# Patient Record
Sex: Female | Born: 1995 | Hispanic: No | Marital: Single
Health system: Southern US, Community
[De-identification: ages and names within clinical notes are randomized; demographics above are authoritative.]

---
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
## 2016-01-12 ENCOUNTER — Other Ambulatory Visit: Payer: BLUE CROSS/BLUE SHIELD

## 2016-01-25 ENCOUNTER — Ambulatory Visit
Admission: RE | Admit: 2016-01-25 | Discharge: 2016-01-25 | Disposition: A | Discharge status: Home / Self Care | Payer: No Typology Code available for payment source | Source: Ambulatory Visit | Attending: Internal Medicine | Admitting: Internal Medicine

## 2016-01-25 DIAGNOSIS — N6320 Unspecified lump in the left breast, unspecified quadrant: Secondary | ICD-10-CM

## 2016-01-25 DIAGNOSIS — N6310 Unspecified lump in the right breast, unspecified quadrant: Secondary | ICD-10-CM

## 2016-08-22 ENCOUNTER — Other Ambulatory Visit: Payer: Self-pay | Admitting: Internal Medicine

## 2016-08-22 DIAGNOSIS — D241 Benign neoplasm of right breast: Secondary | ICD-10-CM

## 2016-08-22 DIAGNOSIS — D242 Benign neoplasm of left breast: Principal | ICD-10-CM

## 2016-09-05 ENCOUNTER — Other Ambulatory Visit: Payer: No Typology Code available for payment source

## 2016-09-08 ENCOUNTER — Ambulatory Visit
Admission: RE | Admit: 2016-09-08 | Discharge: 2016-09-08 | Disposition: A | Discharge status: Home / Self Care | Payer: BLUE CROSS/BLUE SHIELD | Source: Ambulatory Visit | Attending: Internal Medicine | Admitting: Internal Medicine

## 2016-09-08 ENCOUNTER — Other Ambulatory Visit: Payer: Self-pay | Admitting: Internal Medicine

## 2016-09-08 DIAGNOSIS — D242 Benign neoplasm of left breast: Principal | ICD-10-CM

## 2016-09-08 DIAGNOSIS — D241 Benign neoplasm of right breast: Secondary | ICD-10-CM

## 2016-10-10 IMAGING — CR DG CHEST 2V
2 series · 2 of 2 positions shown · non-contrast
Comparison: None.

CLINICAL DATA: Right chest wall/rib palpable abnormality on exam.

EXAM:
CHEST  2 VIEW

[w chest pa]
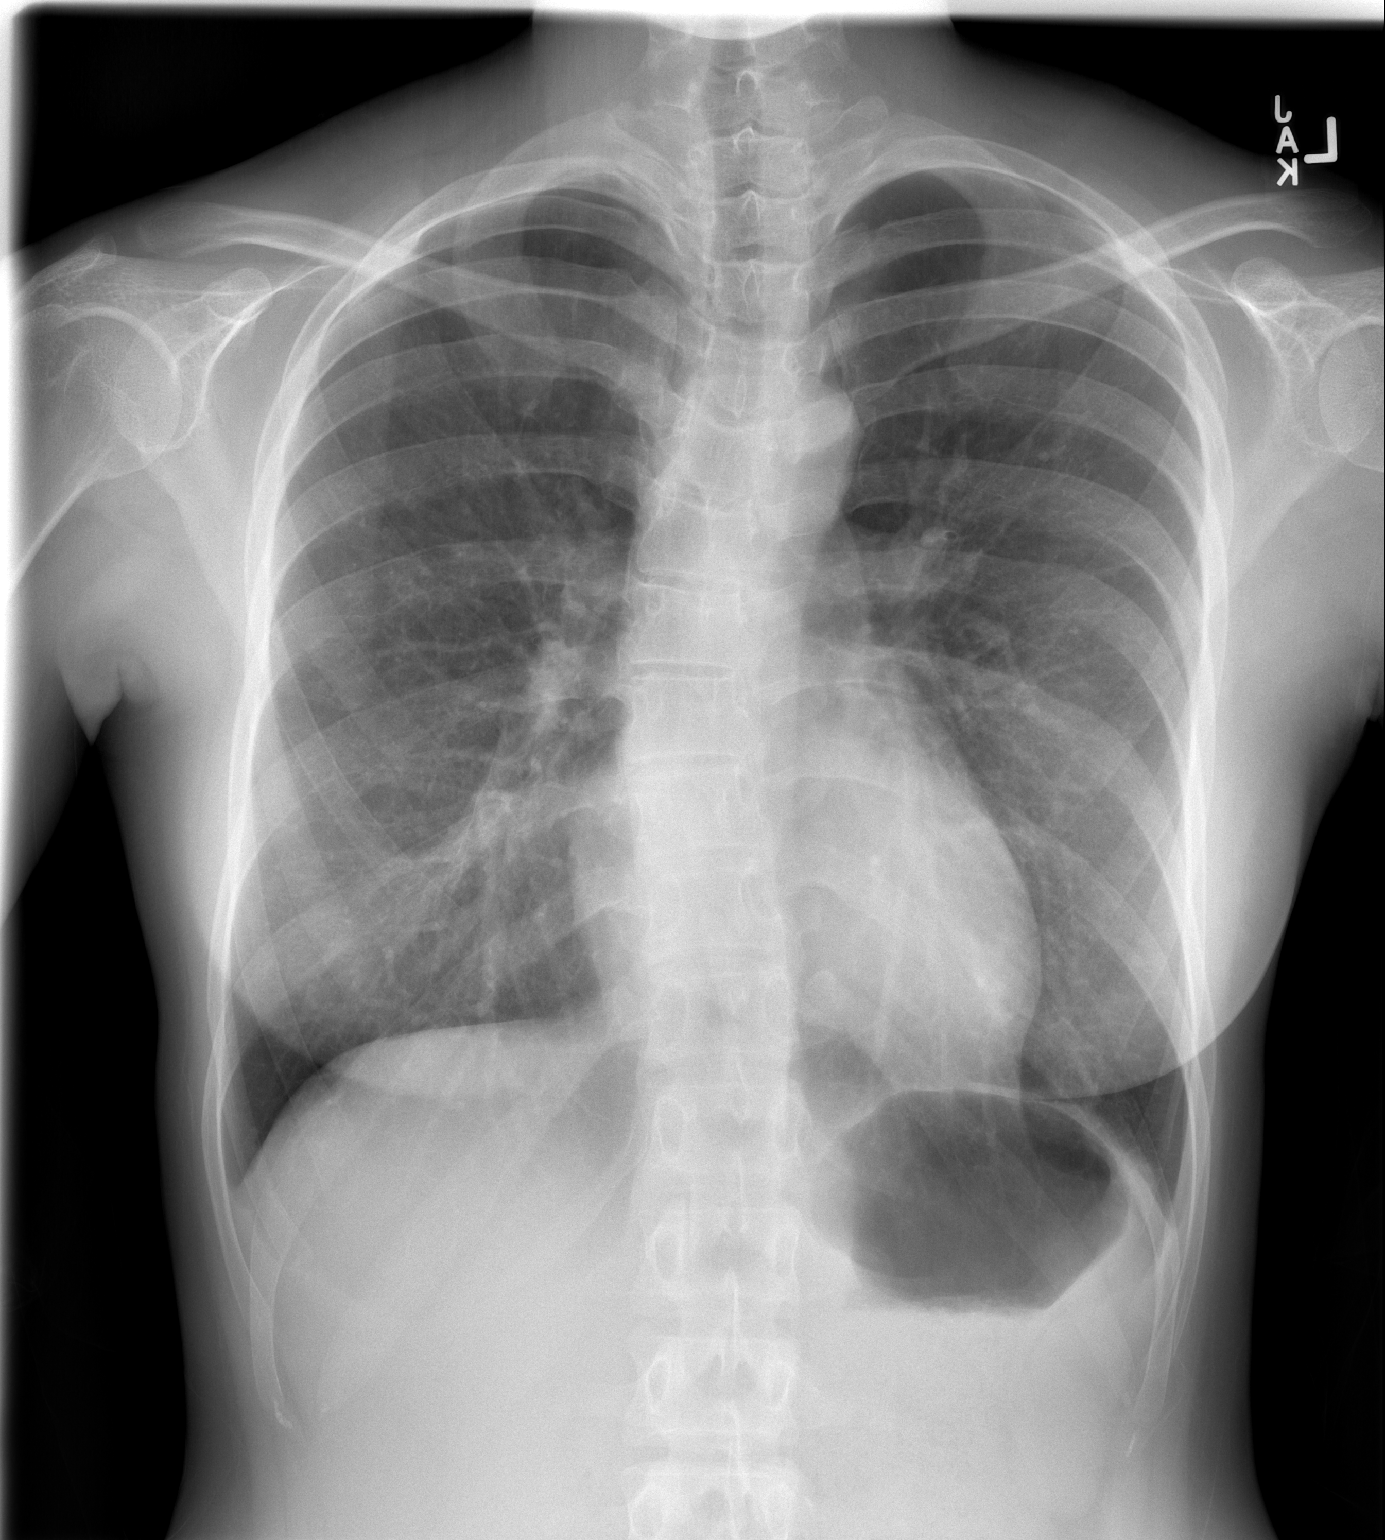

[w chest lat]
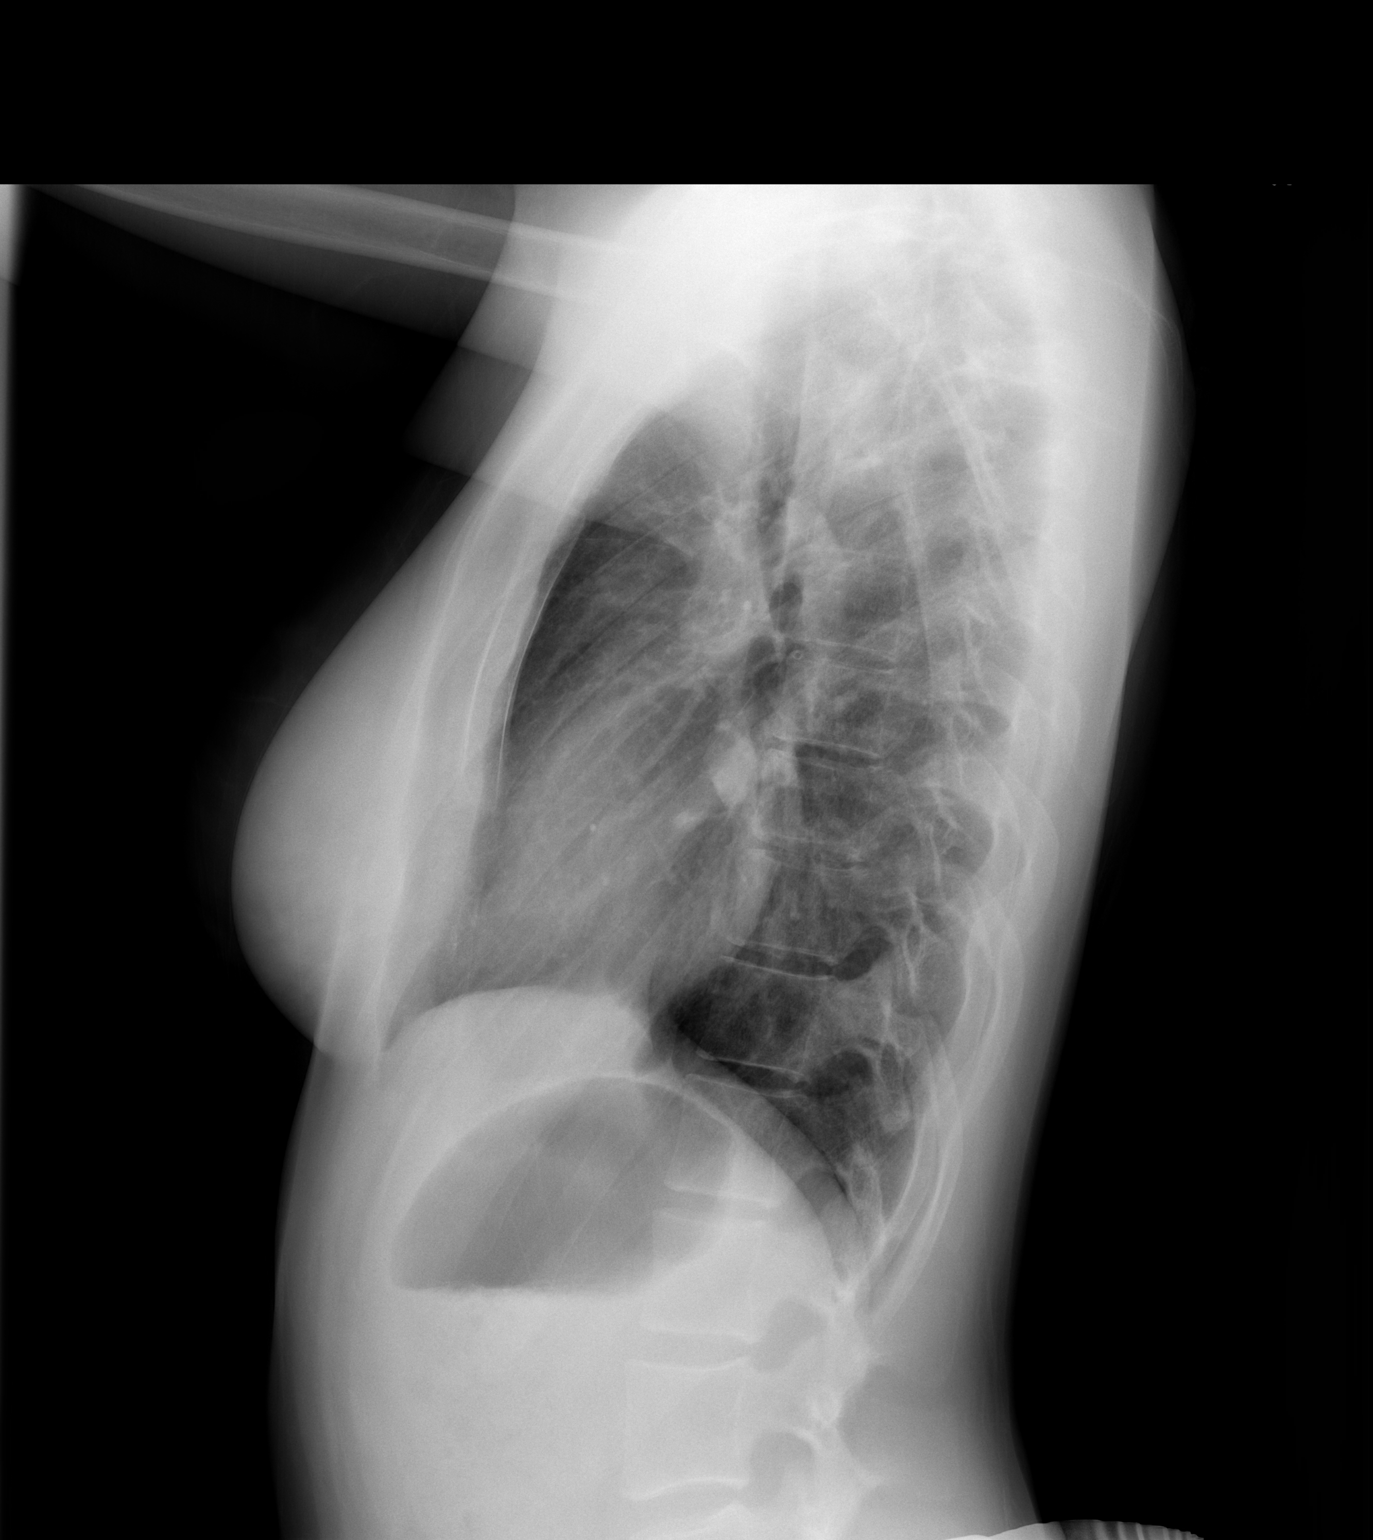

[2 of 2 positions shown; findings below may reference images not displayed]

FINDINGS: The heart size and mediastinal contours are within normal limits.
Both lungs are clear. No evidence of pneumothorax or pleural
effusion.

Mild thoracic dextroscoliosis is seen.  Mild pectus excavatum noted.
IMPRESSION: No active cardiopulmonary disease.

Mild pectus excavatum and thoracic dextroscoliosis.

## 2017-03-14 ENCOUNTER — Other Ambulatory Visit: Payer: BLUE CROSS/BLUE SHIELD

## 2017-08-04 ENCOUNTER — Other Ambulatory Visit: Payer: Self-pay | Admitting: Internal Medicine

## 2017-08-04 DIAGNOSIS — N390 Urinary tract infection, site not specified: Secondary | ICD-10-CM

## 2017-08-10 ENCOUNTER — Other Ambulatory Visit: Payer: BLUE CROSS/BLUE SHIELD

## 2017-08-14 ENCOUNTER — Other Ambulatory Visit: Payer: BLUE CROSS/BLUE SHIELD

## 2017-08-22 ENCOUNTER — Other Ambulatory Visit: Payer: BLUE CROSS/BLUE SHIELD

## 2018-07-05 ENCOUNTER — Ambulatory Visit: Payer: Self-pay | Admitting: *Deleted

## 2018-07-05 NOTE — Telephone Encounter (Signed)
  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   She does not have symptoms but was exposed to a co-worker who tested positive for the virus.  I asked if she had a PCP and he is Dr. Jilda Panda.   She is going to call him and see if there is somewhere she can go for a rapid COVID-19 test because she needs the results by Monday.   Our tests require 48-72 hr turn around time.   She needs a result before then.  I let her know to call back or have her doctor put in an order if there isn't a place to get a rapid test done.   She was agreeable to this plan.   Reason for Disposition . [1] COVID-19 EXPOSURE (Close Contact) within last 14 days AND [2] needs COVID-19 lab test to return to work AND [3] NO symptoms  Answer Assessment - Initial Assessment Questions 1. CLOSE CONTACT: "Who is the person with the confirmed or suspected COVID-19 infection that you were exposed to?"  I was exposed to a co-worker who is positive for COVID-19.   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  I don't have any symptoms.     *No Answer*2. PLACE of CONTACT: "Where were you when you were exposed to COVID-19?" (e.g., home, school, medical waiting room; which city?)     *No Answer*My work place 3. TYPE of CONTACT: "How much contact was there?" (e.g., sitting next to, live in same house, work in same office, same building)     We were working 6 feet apart because we have to work together.   I had my mask on. 4. DURATION of CONTACT: "How long were you in contact with the COVID-19 patient?" (e.g., a few seconds, passed by person, a few minutes, live with the patient)     I worked with her Friday and Sunday for 8 hr shifts. 5. DATE of CONTACT: "When did you have contact with a COVID-19 patient?" (e.g., how many days ago)     See above 6. TRAVEL: "Have you traveled out of the country recently?" If so, "When and where?"     * Also ask about out-of-state  travel, since the CDC has identified some high-risk cities for community spread in the [place].     * Note: Travel becomes less relevant if there is widespread community transmission where the patient lives.     No travels 7. COMMUNITY SPREAD: "Are there lots of cases of COVID-19 (community spread) where you live?" (See public health department website, if unsure)       Yes 8. SYMPTOMS: "Do you have any symptoms?" (e.g., fever, cough, breathing difficulty)     No 9. PREGNANCY OR POSTPARTUM: "Is there any chance you are pregnant?" "When was your last menstrual period?" "Did you deliver in the last 2 weeks?"     No 10. HIGH RISK: "Do you have any heart or lung problems? Do you have a weak immune system?" (e.g., CHF, COPD, asthma, HIV positive, chemotherapy, renal failure, diabetes mellitus, sickle cell anemia)       hypothyroidism  Protocols used: CORONAVIRUS (COVID-19) EXPOSURE-A-AH

## 2019-04-04 ENCOUNTER — Ambulatory Visit: Payer: Self-pay | Attending: Internal Medicine

## 2019-04-04 DIAGNOSIS — Z23 Encounter for immunization: Secondary | ICD-10-CM

## 2019-04-04 NOTE — Progress Notes (Signed)
   Covid-19 Vaccination Clinic  Name:  Ariel Griffin    MRN: [id number] DOB: [birth year]  04/04/2019  [name] was observed post Covid-19 immunization for 15 minutes without incident. She was provided with Vaccine Information Sheet and instruction to access the V-Safe system.   [name] was instructed to call 911 with any severe reactions post vaccine: Marland Kitchen Difficulty breathing  . Swelling of face and throat  . A fast heartbeat  . A bad rash all over body  . Dizziness and weakness   Immunizations Administered    Name Date Dose VIS Date Route   Pfizer COVID-19 Vaccine 04/04/2019 11:48 AM 0.3 mL 12/21/2018 Intramuscular   Manufacturer: [place]   Lot: [id number]   [place]: [id number]

## 2019-04-04 NOTE — Progress Notes (Signed)
   Covid-19 Vaccination Clinic  Name:  Ariel Griffin    MRN: [id number] DOB: 18-Dec-1995  04/04/2019  [name] was observed post Covid-19 immunization for 15 minutes without incident. She was provided with Vaccine Information Sheet and instruction to access the V-Safe system.   [name] was instructed to call 911 with any severe reactions post vaccine: Marland Kitchen Difficulty breathing  . Swelling of face and throat  . A fast heartbeat  . A bad rash all over body  . Dizziness and weakness   Immunizations Administered    Name Date Dose VIS Date Route   Pfizer COVID-19 Vaccine 04/04/2019 11:48 AM 0.3 mL 12/21/2018 Intramuscular   Manufacturer: [place]   Lot: [id number]   [place]: [id number]

## 2019-04-29 ENCOUNTER — Ambulatory Visit: Payer: Self-pay | Attending: Internal Medicine

## 2019-04-29 DIAGNOSIS — Z23 Encounter for immunization: Secondary | ICD-10-CM

## 2019-04-29 NOTE — Progress Notes (Signed)
   Covid-19 Vaccination Clinic  Name:  Ariel Griffin    MRN: [id number] DOB: 1995/04/08  04/29/2019  Ms. [name] was observed post Covid-19 immunization for 15 minutes without incident. She was provided with Vaccine Information Sheet and instruction to access the V-Safe system.   Ms. [name] was instructed to call 911 with any severe reactions post vaccine: Marland Kitchen Difficulty breathing  . Swelling of face and throat  . A fast heartbeat  . A bad rash all over body  . Dizziness and weakness   Immunizations Administered    Name Date Dose VIS Date Route   Pfizer COVID-19 Vaccine 04/29/2019 12:19 PM 0.3 mL 03/06/2018 Intramuscular   Manufacturer: [place]   Lot: [id number]   [place]: [id number]

## 2020-05-05 ENCOUNTER — Other Ambulatory Visit: Payer: Self-pay | Admitting: Internal Medicine

## 2020-05-05 ENCOUNTER — Ambulatory Visit
Admission: RE | Admit: 2020-05-05 | Discharge: 2020-05-05 | Disposition: A | Discharge status: Home / Self Care | Payer: BC Managed Care – PPO | Source: Ambulatory Visit | Attending: Internal Medicine | Admitting: Internal Medicine

## 2020-05-05 DIAGNOSIS — R0602 Shortness of breath: Secondary | ICD-10-CM

## 2020-05-05 DIAGNOSIS — R053 Chronic cough: Secondary | ICD-10-CM

## 2020-11-06 ENCOUNTER — Other Ambulatory Visit: Payer: Self-pay | Admitting: Urology

## 2020-11-06 DIAGNOSIS — R102 Pelvic and perineal pain: Secondary | ICD-10-CM

## 2021-05-11 ENCOUNTER — Other Ambulatory Visit: Payer: Self-pay | Admitting: Internal Medicine

## 2021-05-11 DIAGNOSIS — N6310 Unspecified lump in the right breast, unspecified quadrant: Secondary | ICD-10-CM

## 2021-05-11 DIAGNOSIS — Z09 Encounter for follow-up examination after completed treatment for conditions other than malignant neoplasm: Secondary | ICD-10-CM

## 2021-05-28 ENCOUNTER — Ambulatory Visit
Admission: RE | Admit: 2021-05-28 | Discharge: 2021-05-28 | Disposition: A | Discharge status: Home / Self Care | Payer: Self-pay | Source: Ambulatory Visit | Attending: Internal Medicine | Admitting: Internal Medicine

## 2021-05-28 DIAGNOSIS — N6310 Unspecified lump in the right breast, unspecified quadrant: Secondary | ICD-10-CM

## 2021-05-28 DIAGNOSIS — Z09 Encounter for follow-up examination after completed treatment for conditions other than malignant neoplasm: Secondary | ICD-10-CM

## 2023-05-15 ENCOUNTER — Ambulatory Visit: Payer: Self-pay | Admitting: Obstetrics

## 2023-06-09 ENCOUNTER — Ambulatory Visit: Payer: Self-pay | Admitting: Obstetrics

## 2023-06-28 ENCOUNTER — Other Ambulatory Visit: Payer: Self-pay | Admitting: Obstetrics & Gynecology

## 2023-06-28 DIAGNOSIS — N644 Mastodynia: Secondary | ICD-10-CM

## 2023-07-19 ENCOUNTER — Other Ambulatory Visit: Payer: Self-pay

## 2023-07-19 ENCOUNTER — Inpatient Hospital Stay: Admission: RE | Admit: 2023-07-19 | Payer: Self-pay | Source: Ambulatory Visit

## 2023-07-24 ENCOUNTER — Ambulatory Visit
Admission: RE | Admit: 2023-07-24 | Discharge: 2023-07-24 | Disposition: A | Discharge status: Home / Self Care | Payer: Self-pay | Source: Ambulatory Visit | Attending: Obstetrics & Gynecology | Admitting: Obstetrics & Gynecology

## 2023-07-24 ENCOUNTER — Other Ambulatory Visit: Payer: Self-pay

## 2023-07-24 DIAGNOSIS — N644 Mastodynia: Secondary | ICD-10-CM
